# Patient Record
Sex: Female | Born: 1985 | Race: Black or African American | Hispanic: No | Marital: Single | Smoking: Never smoker
Health system: Southern US, Community
[De-identification: ages and names within clinical notes are randomized; demographics above are authoritative.]

## PROBLEM LIST (undated history)

## (undated) HISTORY — PX: OTHER PROCEDURE: U1053

---

## 2007-10-05 ENCOUNTER — Emergency Department (HOSPITAL_COMMUNITY): Admission: EM | Admit: 2007-10-05 | Discharge: 2007-10-05 | Payer: Self-pay | Admitting: Emergency Medicine

## 2008-01-04 ENCOUNTER — Emergency Department (HOSPITAL_COMMUNITY): Admission: EM | Admit: 2008-01-04 | Discharge: 2008-01-04 | Payer: Self-pay | Admitting: Emergency Medicine

## 2010-11-15 LAB — URINALYSIS, ROUTINE W REFLEX MICROSCOPIC
Nitrite: NEGATIVE
Specific Gravity, Urine: 1.03
pH: 7

## 2010-11-15 LAB — URINE MICROSCOPIC-ADD ON

## 2010-11-15 LAB — WET PREP, GENITAL: Clue Cells Wet Prep HPF POC: NONE SEEN

## 2015-05-21 ENCOUNTER — Emergency Department (HOSPITAL_COMMUNITY): Payer: Self-pay

## 2015-05-21 ENCOUNTER — Encounter (HOSPITAL_COMMUNITY): Payer: Self-pay | Admitting: *Deleted

## 2015-05-21 ENCOUNTER — Emergency Department (HOSPITAL_COMMUNITY)
Admission: EM | Admit: 2015-05-21 | Discharge: 2015-05-21 | Disposition: A | Discharge status: Home / Self Care | Payer: Self-pay | Attending: Emergency Medicine | Admitting: Emergency Medicine

## 2015-05-21 DIAGNOSIS — R109 Unspecified abdominal pain: Secondary | ICD-10-CM

## 2015-05-21 DIAGNOSIS — R1032 Left lower quadrant pain: Secondary | ICD-10-CM | POA: Insufficient documentation

## 2015-05-21 DIAGNOSIS — R42 Dizziness and giddiness: Secondary | ICD-10-CM | POA: Insufficient documentation

## 2015-05-21 DIAGNOSIS — R1031 Right lower quadrant pain: Secondary | ICD-10-CM | POA: Insufficient documentation

## 2015-05-21 DIAGNOSIS — Z3202 Encounter for pregnancy test, result negative: Secondary | ICD-10-CM | POA: Insufficient documentation

## 2015-05-21 DIAGNOSIS — M549 Dorsalgia, unspecified: Secondary | ICD-10-CM | POA: Insufficient documentation

## 2015-05-21 DIAGNOSIS — Z79899 Other long term (current) drug therapy: Secondary | ICD-10-CM | POA: Insufficient documentation

## 2015-05-21 LAB — COMPREHENSIVE METABOLIC PANEL
ALBUMIN: 3.5 g/dL (ref 3.5–5.0)
ALT: 18 U/L (ref 14–54)
ANION GAP: 10 (ref 5–15)
AST: 19 U/L (ref 15–41)
Alkaline Phosphatase: 53 U/L (ref 38–126)
BUN: 8 mg/dL (ref 6–20)
CHLORIDE: 102 mmol/L (ref 101–111)
CO2: 26 mmol/L (ref 22–32)
Calcium: 9.1 mg/dL (ref 8.9–10.3)
Creatinine, Ser: 0.66 mg/dL (ref 0.44–1.00)
GFR calc Af Amer: >60 mL/min (ref >60)
Glucose, Bld: 89 mg/dL (ref 65–99)
POTASSIUM: 3.9 mmol/L (ref 3.5–5.1)
Sodium: 138 mmol/L (ref 135–145)
TOTAL PROTEIN: 7.2 g/dL (ref 6.5–8.1)
Total Bilirubin: 0.5 mg/dL (ref 0.3–1.2)

## 2015-05-21 LAB — URINALYSIS, ROUTINE W REFLEX MICROSCOPIC
Bilirubin Urine: NEGATIVE
GLUCOSE, UA: NEGATIVE mg/dL
Hgb urine dipstick: NEGATIVE
Ketones, ur: NEGATIVE mg/dL
LEUKOCYTES UA: NEGATIVE
NITRITE: NEGATIVE
PH: 5.5 (ref 5.0–8.0)
Protein, ur: NEGATIVE mg/dL
SPECIFIC GRAVITY, URINE: 1.026 (ref 1.005–1.030)

## 2015-05-21 LAB — WET PREP, GENITAL
CLUE CELLS WET PREP: NONE SEEN
SPERM: NONE SEEN
TRICH WET PREP: NONE SEEN
YEAST WET PREP: NONE SEEN

## 2015-05-21 LAB — CBC
HEMATOCRIT: 35.3 % — AB (ref 36.0–46.0)
HEMOGLOBIN: 10.3 g/dL — AB (ref 12.0–15.0)
MCH: 20.3 pg — ABNORMAL LOW (ref 26.0–34.0)
MCHC: 29.2 g/dL — ABNORMAL LOW (ref 30.0–36.0)
MCV: 69.6 fL — AB (ref 78.0–100.0)
Platelets: 303 10*3/uL (ref 150–400)
RBC: 5.07 MIL/uL (ref 3.87–5.11)
RDW: 19.6 % — AB (ref 11.5–15.5)
WBC: 7.6 10*3/uL (ref 4.0–10.5)

## 2015-05-21 LAB — HCG, QUANTITATIVE, PREGNANCY: hCG, Beta Chain, Quant, S: <1 m[IU]/mL (ref <5)

## 2015-05-21 NOTE — ED Provider Notes (Signed)
CSN: [id number]     Arrival date & time 05/21/15  1753 History   First MD Initiated Contact with Patient 05/21/15 2014     Chief Complaint  Patient presents with  . Dizziness  . Back Pain   (Consider location/radiation/quality/duration/timing/severity/associated sxs/prior Treatment) Patient is a 30 y.o. female presenting with dizziness and back pain. The history is provided by the patient and a relative. No language interpreter was used.  Dizziness Back Pain Janice Lewis is a 30 y.o female with no past medical history who presents with lower abdominal cramping and back pain that feels like her menstrual cycle for the past week. She states she returned from [place] 3 days ago and while she was there she had dizziness. They diagnosed her with malaria and she had some shots while she was there. She is also diagnosed with a UTI and anemia. She has been taking iron and took antibiotics for the UTI. She reports urinary frequency but no hematuria or dysuria. She denies any fever, chills, nausea, vomiting. She states that her menstrual cycles have been very light recently and are usually heavy. There also darker in color than normal. Her last menstrual period was 05/09/2015. She reports still feeling dizzy since returning from [place]. She is sexually active and not on birth control. Denies any history of STDs. She denies any fever, chills, night sweats, chest pain, shortness of breath, nausea, vomiting, diarrhea, or constipation.  History reviewed. No pertinent past medical history. History reviewed. No pertinent past surgical history. No family history on file. Social History  Substance Use Topics  . Smoking status: Never Smoker   . Smokeless tobacco: None  . Alcohol Use: No   OB History    No data available     Review of Systems  Musculoskeletal: Positive for back pain.  Neurological: Positive for dizziness.  All other systems reviewed and are negative.     Allergies  Review of patient's  allergies indicates no known allergies.  Home Medications   Prior to Admission medications   Medication Sig Start Date End Date Taking? Authorizing Provider  Iron TABS Take 1 tablet by mouth daily.   Yes Historical Provider, MD   BP 118/88 mmHg  Pulse 80  Temp(Src) 98.4 F (36.9 C) (Oral)  Resp 20  Ht  (1.626 m)  Wt 68.947 kg  BMI 26.08 kg/m2  SpO2 99%  LMP 05/09/2015 Physical Exam  Constitutional: She is oriented to person, place, and time. She appears well-developed and well-nourished. No distress.  HENT:  Head: Normocephalic and atraumatic.  Eyes: Conjunctivae are normal.  Neck: Normal range of motion. Neck supple.  Cardiovascular: Normal rate, regular rhythm and normal heart sounds.   Pulmonary/Chest: Effort normal and breath sounds normal. No respiratory distress. She has no wheezes.  Abdominal: Soft. Normal appearance. She exhibits no distension. There is tenderness in the suprapubic area. There is no rebound, no guarding and no CVA tenderness.    Bilateral suprapubic abdominal tenderness. No guarding or rebound.  No CVA tenderness. Normal appearing abdomen.   Naval piercing without signs of infection.   Genitourinary: Pelvic exam was performed with patient supine.  Pelvic exam: Chaperone present. Copious amounts of white odorous vaginal discharge. No vaginal bleeding. No CMT. Bilateral adnexal tenderness.  Musculoskeletal: Normal range of motion.  Neurological: She is alert and oriented to person, place, and time.  Skin: Skin is warm and dry.  Nursing note and vitals reviewed.   ED Course  Procedures (including critical care time) Labs  Review Labs Reviewed  WET PREP, GENITAL - Abnormal; Notable for the following:    WBC, Wet Prep HPF POC FEW (*)    All other components within normal limits  CBC - Abnormal; Notable for the following:    Hemoglobin 10.3 (*)    HCT 35.3 (*)    MCV 69.6 (*)    MCH 20.3 (*)    MCHC 29.2 (*)    RDW 19.6 (*)    All other  components within normal limits  COMPREHENSIVE METABOLIC PANEL  URINALYSIS, ROUTINE W REFLEX MICROSCOPIC (NOT AT [facility])  HCG, QUANTITATIVE, PREGNANCY  GC/CHLAMYDIA PROBE AMP ([place]) NOT AT [facility]    Imaging Review Kore[place] Transvaginal Non-ob  05/21/2015  CLINICAL DATA:  Pelvic pain EXAM: TRANSABDOMINAL AND TRANSVAGINAL ULTRASOUND OF PELVIS TECHNIQUE: Both transabdominal and transvaginal ultrasound examinations of the pelvis were performed. Transabdominal technique was performed for global imaging of the pelvis including uterus, ovaries, adnexal regions, and pelvic cul-de-sac. It was necessary to proceed with endovaginal exam following the transabdominal exam to visualize the ovaries. COMPARISON:  None FINDINGS: Uterus Measurements: 6.0 x 4.1 x 4.5 cm. No fibroids or other mass visualized. Endometrium Thickness: 14 mm.  Mildly heterogeneous Right ovary Measurements: 3.9 x 2.9 x 3.9 cm. Normal appearance/no adnexal mass. Left ovary Measurements: 3.8 x 2.7 x 2.7 cm. Normal appearance/no adnexal mass. Other findings Small amount of free pelvic fluid is seen. This may be physiologic in nature. IMPRESSION: Free fluid in the pelvis which may be physiologic in nature. Mildly prominent endometrium likely related to the patient's menstrual status. Electronically Signed   By: Alcide CleverMark  Lukens M.D.   On: 05/21/2015 21:27   [place] Pelvis Complete  05/21/2015  CLINICAL DATA:  Pelvic pain EXAM: TRANSABDOMINAL AND TRANSVAGINAL ULTRASOUND OF PELVIS TECHNIQUE: Both transabdominal and transvaginal ultrasound examinations of the pelvis were performed. Transabdominal technique was performed for global imaging of the pelvis including uterus, ovaries, adnexal regions, and pelvic cul-de-sac. It was necessary to proceed with endovaginal exam following the transabdominal exam to visualize the ovaries. COMPARISON:  None FINDINGS: Uterus Measurements: 6.0 x 4.1 x 4.5 cm. No fibroids or other mass visualized. Endometrium Thickness: 14 mm.   Mildly heterogeneous Right ovary Measurements: 3.9 x 2.9 x 3.9 cm. Normal appearance/no adnexal mass. Left ovary Measurements: 3.8 x 2.7 x 2.7 cm. Normal appearance/no adnexal mass. Other findings Small amount of free pelvic fluid is seen. This may be physiologic in nature. IMPRESSION: Free fluid in the pelvis which may be physiologic in nature. Mildly prominent endometrium likely related to the patient's menstrual status. Electronically Signed   By: Alcide CleverMark  Lukens M.D.   On: 05/21/2015 21:27   I have personally reviewed and evaluated these images and lab results as part of my medical decision-making.   EKG Interpretation None      Orthostatics:  Vital Signs - Pulse Rate: 79 ; Pulse Rate Source: Monitor ; Resp: 18 ; BP: 120/83 mmHg ; BP Location: Right Arm ; Patient Position (if appropriate): Lying                22:17:29 Vital Signs BW  Vital Signs - Pulse Rate: 83 ; Pulse Rate Source: Monitor ; Resp: 18 ; BP: 126/89 mmHg ; BP Location: Right Arm ; BP Method: Automatic ; Patient Position (if appropriate): Sitting                22:19:59 Vital Signs BW  Vital Signs - Pulse Rate: 80 ; Pulse Rate Source: Monitor ; Resp:  20 ; BP: 118/88 mmHg ; BP Location: Right Arm ; Patient Position (if appropriate): Standing       MDM   Final diagnoses:  Abdominal pain  Dizziness   Patient diagnosed with malaria, UTI, and anemia one month ago while in [place]. She states she was treated for malaria while she was there. She began having worsening abdominal cramping and back pain in the last week. She denies any nausea or vomiting. Denies any rectal bleeding. She states she has also felt dizzy. She reports stopping her iron medication a week ago. She also states that she feels like her menstrual cycle but it is not due for a while. Vitals are stable. Patient is afebrile. No hypotension that would be suggestive of ovarian torsion. She has no focal abdominal tenderness. She had bilateral suprapubic abdominal  tenderness on exam with adnexal tenderness. She is mildly anemic.  She denies any constipation when using the iron pills.  I discussed resuming this and drinking plenty of fluids. She is not orthostatic in the ED.  Wet prep is not concerning. She has no UTI. Pregnancy negative. She has a small amount of free fluid in the pelvis. I discussed lab findings with patient. I am unsure of etiology of pain. I do not believe is is related to her bowels. No ovarian cyst. No CMT. I discussed following up with women's outpatient clinic. Return precautions discussed and patient agrees with plan.  Filed Vitals:   05/21/15 2219 05/21/15 2220  BP: 118/88 118/88  Pulse: 80 80  Temp:    Resp: 20 [id number] PA-C 05/21/15 2318  Arby Barrette, MD 05/22/15 1450

## 2015-05-21 NOTE — ED Notes (Signed)
Pt states she was just in [place] and returned 3 days ago.  While she was in [place] she was dx with Malaria, anemia and UTI.  Pt states she continues to feel light-headed, weak, have back cramps and menstrual-like cramps.  LMP 3/26.

## 2015-05-21 NOTE — ED Notes (Signed)
Patient transported to Ultrasound 

## 2015-05-21 NOTE — Discharge Instructions (Signed)
Abdominal Pain, Adult Follow-up with women's outpatient clinic. Many things can cause belly (abdominal) pain. Most times, the belly pain is not dangerous. Many cases of belly pain can be watched and treated at home. HOME CARE   Do not take medicines that help you go poop (laxatives) unless told to by your doctor.  Only take medicine as told by your doctor.  Eat or drink as told by your doctor. Your doctor will tell you if you should be on a special diet. GET HELP IF:  You do not know what is causing your belly pain.  You have belly pain while you are sick to your stomach (nauseous) or have runny poop (diarrhea).  You have pain while you pee or poop.  Your belly pain wakes you up at night.  You have belly pain that gets worse or better when you eat.  You have belly pain that gets worse when you eat fatty foods.  You have a fever. GET HELP RIGHT AWAY IF:   The pain does not go away within 2 hours.  You keep throwing up (vomiting).  The pain changes and is only in the right or left part of the belly.  You have bloody or tarry looking poop. MAKE SURE YOU:   Understand these instructions.  Will watch your condition.  Will get help right away if you are not doing well or get worse.   This information is not intended to replace advice given to you by your health care provider. Make sure you discuss any questions you have with your health care provider.   Document Released: 07/19/2007 Document Revised: 02/20/2014 Document Reviewed: 10/09/2012 Elsevier Interactive Patient Education 2016 Elsevier Inc[employer]  Dizziness Dizziness is a common problem. It makes you feel unsteady or lightheaded. You may feel like you are about to pass out (faint). Dizziness can lead to injury if you stumble or fall. Anyone can get dizzy, but dizziness is more common in older adults. This condition can be caused by a number of things, including:  Medicines.  Dehydration.  Illness. HOME  CARE Following these instructions may help with your condition: Eating and Drinking  Drink enough fluid to keep your pee (urine) clear or pale yellow. This helps to keep you from getting dehydrated. Try to drink more clear fluids, such as water.  Do not drink alcohol.  Limit how much caffeine you drink or eat if told by your doctor.  Limit how much salt you drink or eat if told by your doctor. Activity  Avoid making quick movements.  When you stand up from sitting in a chair, steady yourself until you feel okay.  In the morning, first sit up on the side of the bed. When you feel okay, stand slowly while you hold onto something. Do this until you know that your balance is fine.  Move your legs often if you need to stand in one place for a long time. Tighten and relax your muscles in your legs while you are standing.  Do not drive or use heavy machinery if you feel dizzy.  Avoid bending down if you feel dizzy. Place items in your home so that they are easy for you to reach without leaning over. Lifestyle  Do not use any tobacco products, including cigarettes, chewing tobacco, or electronic cigarettes. If you need help quitting, ask your doctor.  Try to lower your stress level, such as with yoga or meditation. Talk with your doctor if you need help. General Instructions  Watch your dizziness for any changes.  Take medicines only as told by your doctor. Talk with your doctor if you think that your dizziness is caused by a medicine that you are taking.  Tell a friend or a family member that you are feeling dizzy. If he or she notices any changes in your behavior, have this person call your doctor.  Keep all follow-up visits as told by your doctor. This is important. GET HELP IF:  Your dizziness does not go away.  Your dizziness or light-headedness gets worse.  You feel sick to your stomach (nauseous).  You have trouble hearing.  You have new symptoms.  You are unsteady on  your feet or you feel like the room is spinning. GET HELP RIGHT AWAY IF:  You throw up (vomit) or have diarrhea and are unable to eat or drink anything.  You have trouble:  Talking.  Walking.  Swallowing.  Using your arms, hands, or legs.  You feel generally weak.  You are not thinking clearly or you have trouble forming sentences. It may take a friend or family member to notice this.  You have:  Chest pain.  Pain in your belly (abdomen).  Shortness of breath.  Sweating.  Your vision changes.  You are bleeding.  You have a headache.  You have neck pain or a stiff neck.  You have a fever.   This information is not intended to replace advice given to you by your health care provider. Make sure you discuss any questions you have with your health care provider.   Document Released: 01/19/2011 Document Revised: 06/16/2014 Document Reviewed: 01/26/2014 Elsevier Interactive Patient Education [employer]

## 2015-05-24 LAB — GC/CHLAMYDIA PROBE AMP (~~LOC~~) NOT AT ARMC
CHLAMYDIA, DNA PROBE: NEGATIVE
Neisseria Gonorrhea: NEGATIVE

## 2015-07-14 ENCOUNTER — Ambulatory Visit (INDEPENDENT_AMBULATORY_CARE_PROVIDER_SITE_OTHER): Payer: Self-pay | Admitting: Physician Assistant

## 2015-07-14 ENCOUNTER — Ambulatory Visit (INDEPENDENT_AMBULATORY_CARE_PROVIDER_SITE_OTHER): Payer: Self-pay

## 2015-07-14 VITALS — BP 110/72 | HR 80 | Temp 98.0°F | Resp 18 | Ht 64.0 in | Wt 159.0 lb

## 2015-07-14 DIAGNOSIS — K5900 Constipation, unspecified: Secondary | ICD-10-CM

## 2015-07-14 DIAGNOSIS — R102 Pelvic and perineal pain: Secondary | ICD-10-CM

## 2015-07-14 DIAGNOSIS — Z01419 Encounter for gynecological examination (general) (routine) without abnormal findings: Secondary | ICD-10-CM

## 2015-07-14 LAB — POCT WET + KOH PREP
Trich by wet prep: ABSENT
YEAST BY WET PREP: ABSENT
Yeast by KOH: ABSENT

## 2015-07-14 MED ORDER — POLYETHYLENE GLYCOL 3350 17 GM/SCOOP PO POWD: ORAL | Status: AC

## 2015-07-14 NOTE — Patient Instructions (Addendum)
  For constipation   Make sure you are drinking enough water daily. Make sure you are getting enough fiber in your diet - this will make you regular - you can eat high fiber foods or use metamucil as a supplement - it is really important to drink enough water when using fiber supplements.  If your stools are hard or are formed balls or you have to strain a stool softener will help - use colace 2-3 capsule a day  For gentle treatment of constipation Use Miralax 2 capfuls a day (I like 1 dose 2x/day) until your stools are soft and regular for a week and then decrease the usage to once a day for at least a month  For more aggressive treatment of constipation Use 4 capfuls of Colace and 6 doses of Miralax and drink it in 2 hours - this should result in several watery stools - if it does not repeat the next day and then go to daily miralax for a week to make sure your bowels are clean and retrained to work properly  For the most aggressive treatment of constipation Use 14 capfuls of Miralax in 1 gallon of fluid (gatoraid or water work well or a combination of the two) and drink over 12h - it is ok to eat during this time and then use Miralax 1 capful daily for about 2 weeks to prevent the constipation from returning    IF you received an x-ray today, you will receive an invoice from [facility]. Please contact [facility] at [phone] with questions or concerns regarding your invoice.   IF you received labwork today, you will receive an invoice from [facility]. Please contact [facility] at [phone] with questions or concerns regarding your invoice.   Our billing staff will not be able to assist you with questions regarding bills from these companies.  You will be contacted with the lab results as soon as they are available. The fastest way to get your results is to activate your My Chart account. Instructions are located on the last page of this  paperwork. If you have not heard from us regarding the results in 2 weeks, please contact this office.    OB/GYN in [facility]  [place] - [phone] [facility] [phone] [facility] - [phone] [facility] - [phone]

## 2015-07-14 NOTE — Progress Notes (Signed)
Janice Lewis  MRN: [id number] DOB: [birth year]  Subjective:  Pt presents to clinic with pelvic pain for about the last 5 months - it seems to be before her menses except this month it has continued after her menses.  She is concerned because she has been trying to get pregnant and she has not been successful.  It feels like cramps when she has it and it goes into her back like when she has cramps but this is more severe than her normal cramps.    She is sexually active with her husband who lives in [place] -   She has intermittent constipation where the stool is harder but she will sometimes have loose stool - she has not been aware of what her stool is like during the times of pain  Menses is regular, bleeds for about 6 days heavy on days 2-5, she gets a lot of cramping with back pain with her menses  Neg gonorrhea and chlamydia in 4/17 at the ED -   There are no active problems to display for this patient.   No current outpatient prescriptions on file prior to visit.   No current facility-administered medications on file prior to visit.    No Known Allergies  Review of Systems  Constitutional: Negative for fever and chills.  Gastrointestinal: Positive for abdominal pain. Negative for nausea, vomiting and diarrhea.  Genitourinary: Negative for menstrual problem.   Objective:  BP 110/72 mmHg  Pulse 80  Temp(Src) 98 F (36.7 C) (Oral)  Resp 18  Ht  (1.626 m)  Wt 159 lb (72.122 kg)  BMI 27.28 kg/m2  SpO2 100%  LMP 07/07/2015  Physical Exam  Constitutional: She is oriented to person, place, and time and well-developed, well-nourished, and in no distress.  HENT:  Head: Normocephalic and atraumatic.  Right Ear: Hearing and external ear normal.  Left Ear: Hearing and external ear normal.  Eyes: Conjunctivae are normal.  Neck: Normal range of motion.  Cardiovascular: Normal rate, regular rhythm and normal heart sounds.   No murmur heard. Pulmonary/Chest: Effort normal  and breath sounds normal. She has no wheezes.  Abdominal: Soft. Bowel sounds are normal. There is tenderness (pelvic region =B). There is no rebound and no guarding.  Genitourinary: Vagina normal, uterus normal, cervix normal, right adnexa normal, left adnexa normal and vulva normal. Uterus is not enlarged and not tender.  Neurological: She is alert and oriented to person, place, and time. Gait normal.  Skin: Skin is warm and dry.  Psychiatric: Mood, memory, affect and judgment normal.  Vitals reviewed.   Results for orders placed or performed in visit on 07/14/15  POCT Wet + KOH Prep  Result Value Ref Range   Yeast by KOH Absent Present, Absent   Yeast by wet prep Absent Present, Absent   WBC by wet prep None None, Few, Too numerous to count   Clue Cells Wet Prep HPF POC None None, Too numerous to count   Trich by wet prep Absent Present, Absent   Bacteria Wet Prep HPF POC Moderate (A) None, Few, Too numerous to count   Epithelial Cells By [employer] ([facility]) Few None, Few, Too numerous to count   RBC,UR,HPF,POC None None RBC/hpf   Dg Abd 1 View  07/14/2015  CLINICAL DATA:  Abdominal cramping and pain.  Constipation EXAM: ABDOMEN - 1 VIEW COMPARISON:  None. FINDINGS: There is fairly mild stool volume in the colon. There is no bowel dilatation or air-fluid level suggesting obstruction.  No free air. There is a tiny presumed phlebolith in the left pelvis. There is a probable bone island in the left inferior iliac crest. IMPRESSION: No demonstrable bowel obstruction or free air. Fairly mild degree of stool volume in the colon. Electronically Signed   By: Bretta BangWilliam  Woodruff III M.D.   On: 07/14/2015 10:59    Assessment and Plan :  Female pelvic pain - Plan: POCT Wet + KOH Prep, DG Abd 1 View  Encounter for routine gynecological examination - Plan: Pap IG w/ reflex to HPV when ASC-U  Constipation, unspecified constipation type - Plan: polyethylene glycol powder (GLYCOLAX/MIRALAX) powder   D/w  pt her US done a month ago was normal.  At this time there is no GYN explanation of her pain and on her KUB she has some constipation - she will try miralax cleanse and if she is no better she plans to get an appt with gyn - names were given today.  We talked about normal time frame for getting pregnant and that her may be a little longer considering she travels to [place] to be with her husband.  We talked about things to be mindful of in regards to her eating and pain and stool patterns and her pain - almost keep a pain journal.  She agreed and understand with the above plan.  Benny LennertSarah Aylah Yeary PA-C  [facility] 07/14/2015 4:00 PM

## 2015-07-16 ENCOUNTER — Encounter: Payer: Self-pay | Admitting: Physician Assistant

## 2015-07-16 LAB — PAP IG W/ RFLX HPV ASCU

## 2016-09-09 IMAGING — US US TRANSVAGINAL NON-OB
1 series · 14 of 25 positions shown · non-contrast
Comparison: None

CLINICAL DATA: Pelvic pain



[Series 1: us transvaginal non-ob · 0.24mm/px · 14 of 70 slices shown]
[im 1/70]
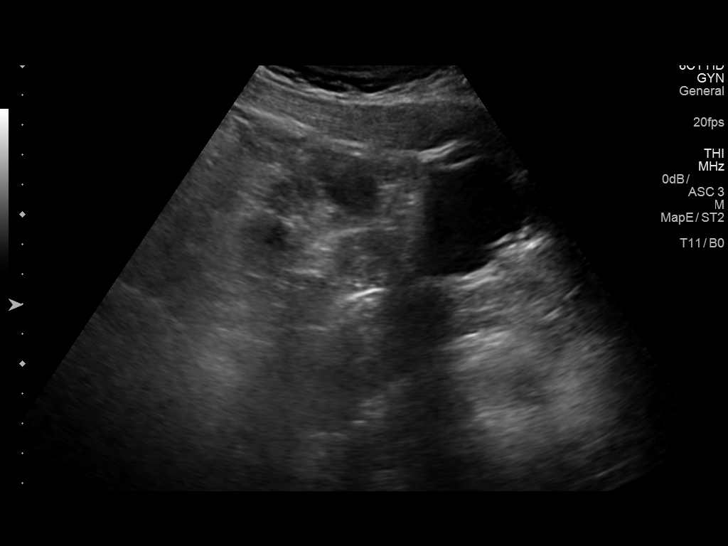
[im 6/70]
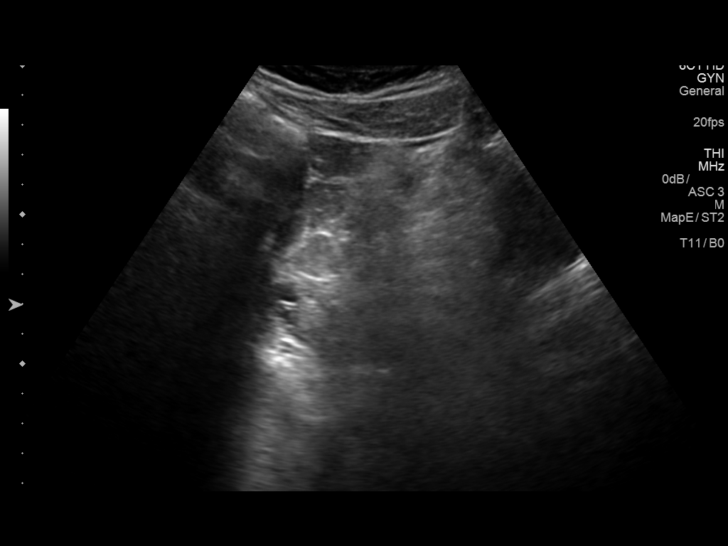
[im 12/70]
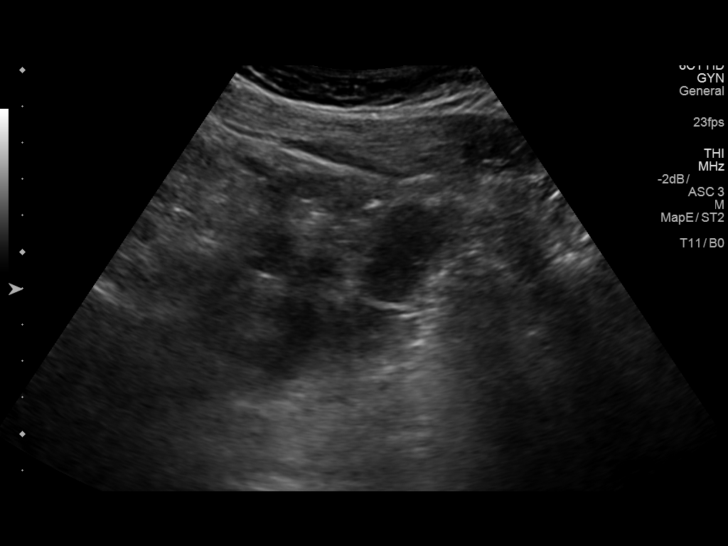
[im 18/70]
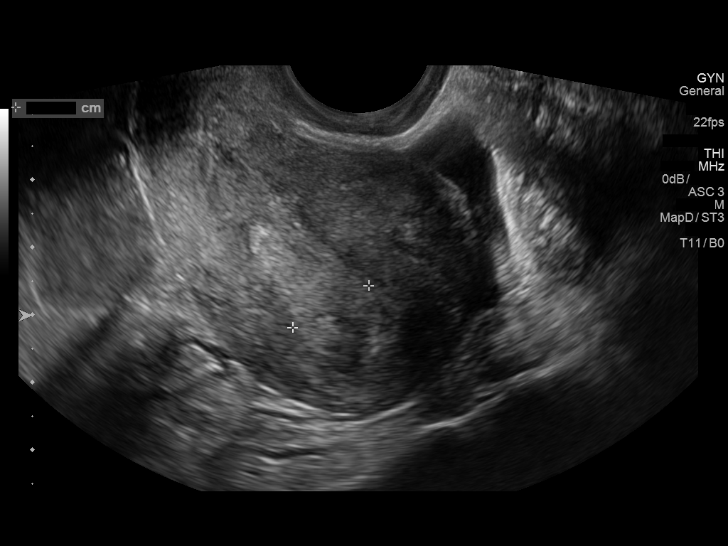
[im 24/70]
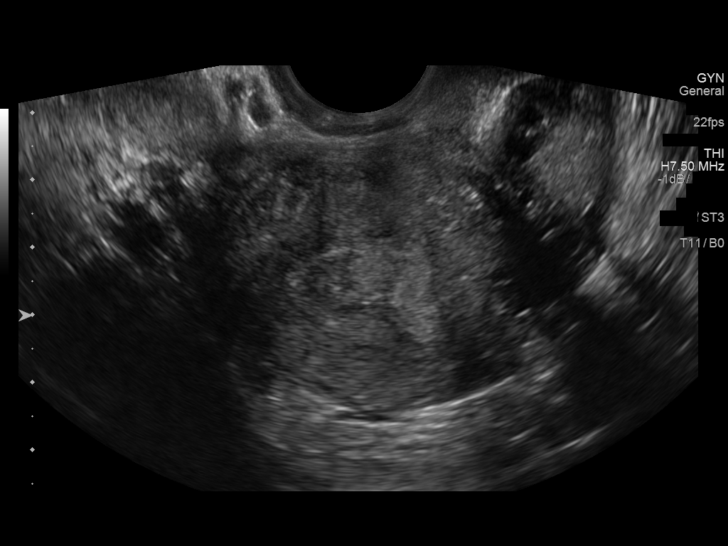
[im 26/70]
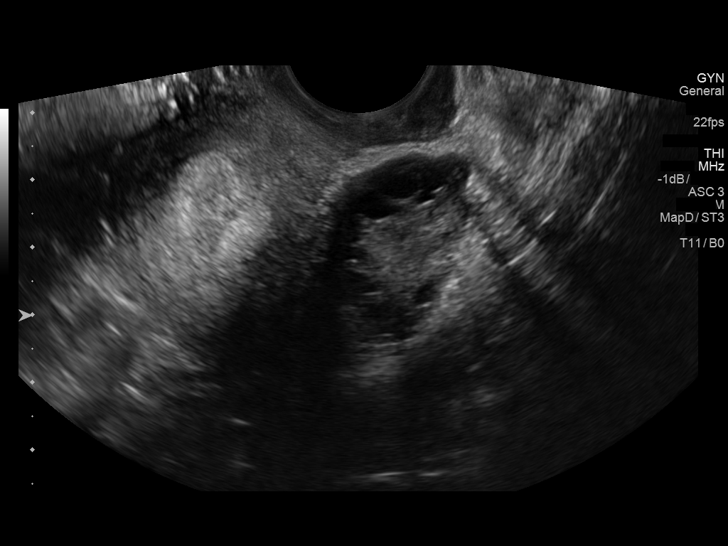
[im 32/70]
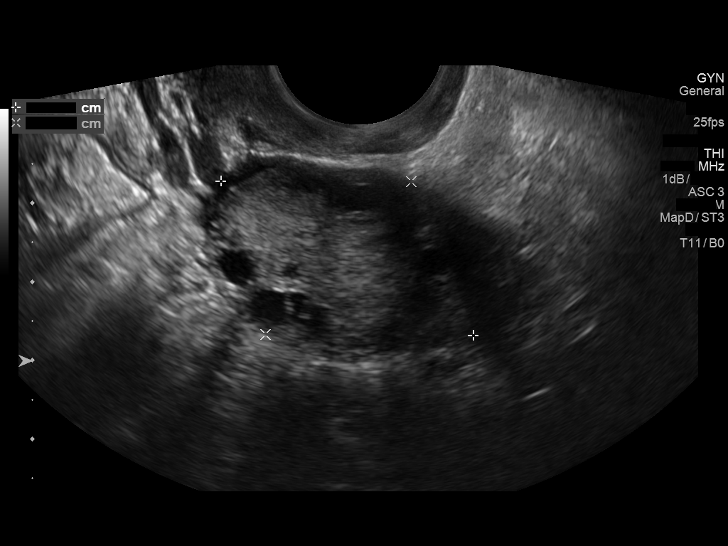
[im 38/70]
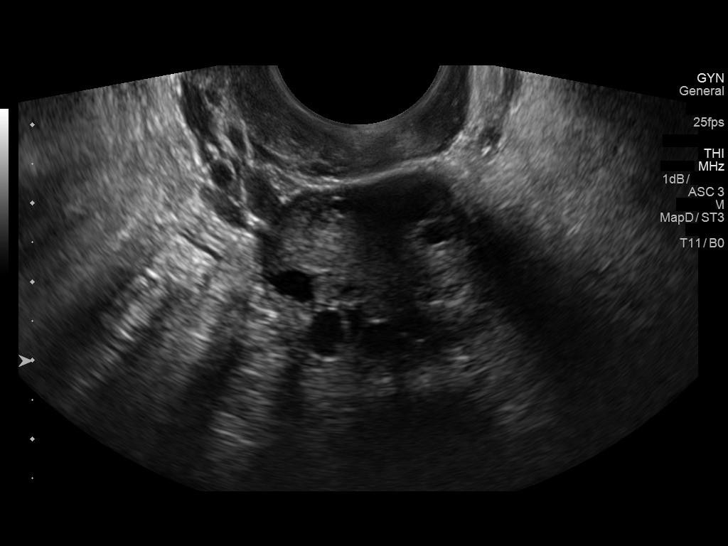
[im 44/70]
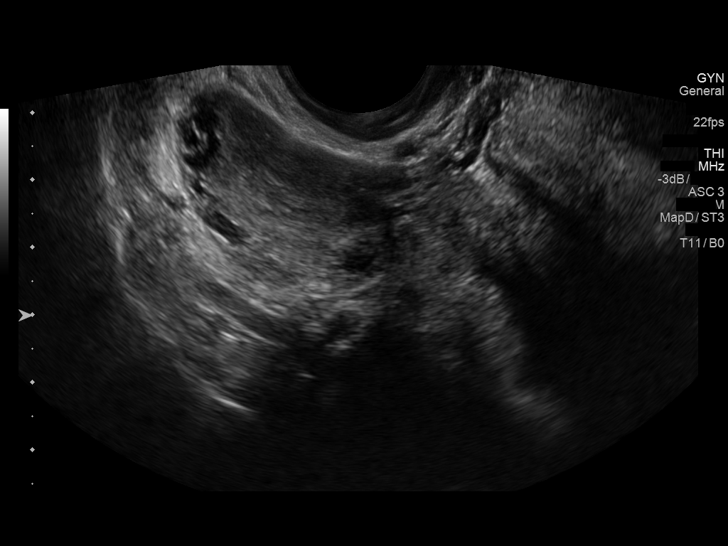
[im 47/70]
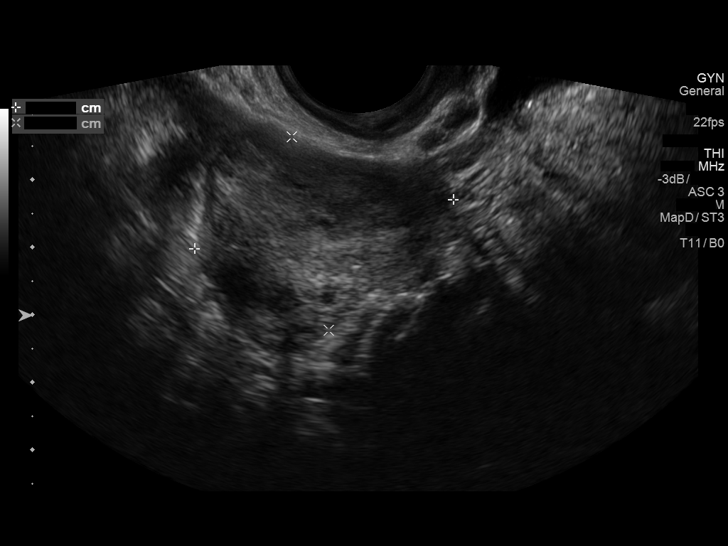
[im 52/70]
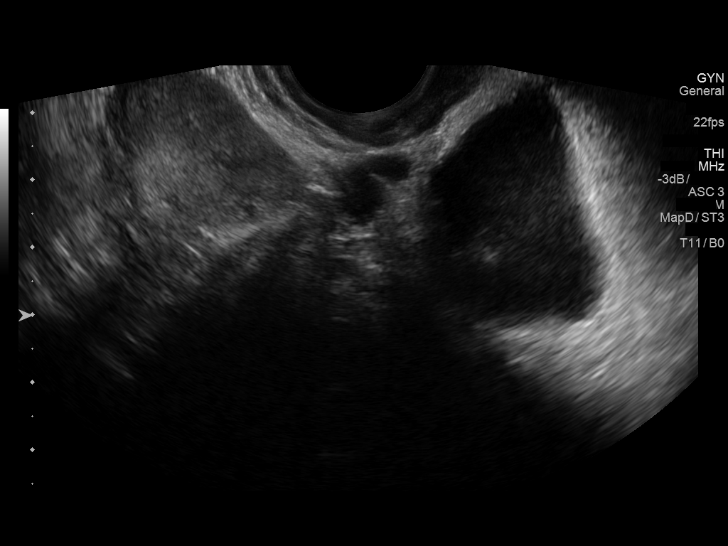
[im 58/70]
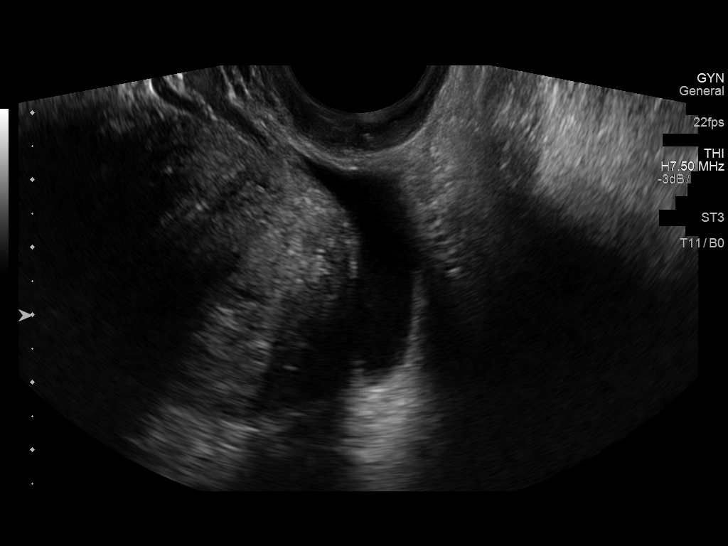
[im 64/70]
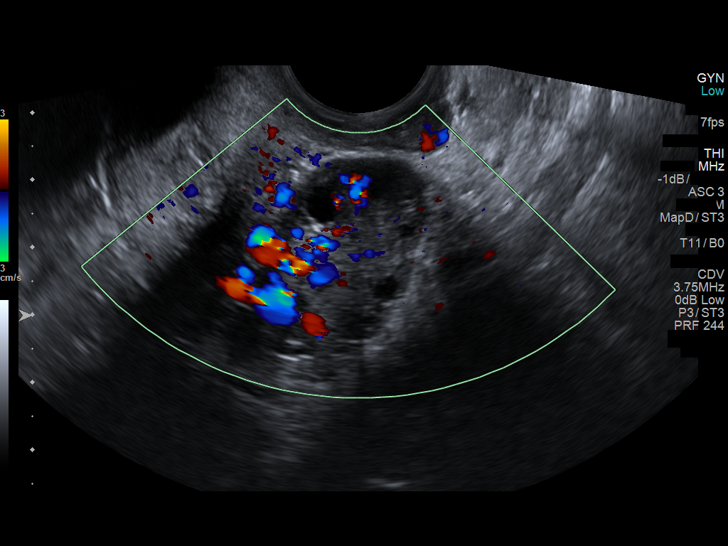
[im 70/70]
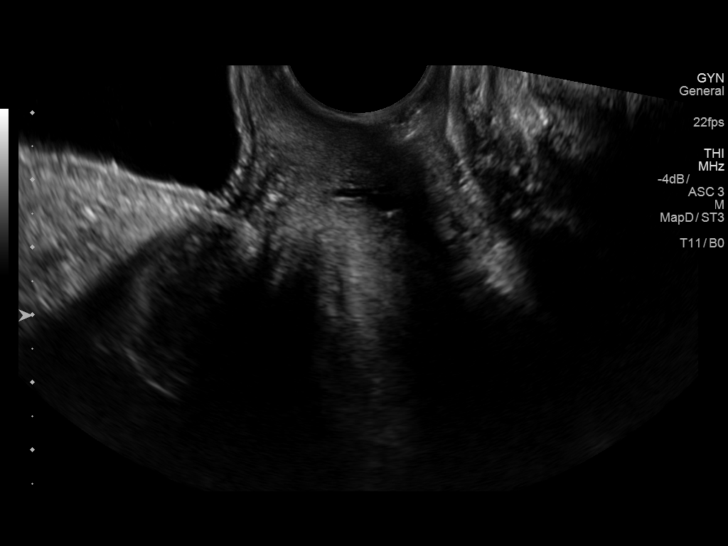

[14 of 25 positions shown; findings below may reference images not displayed]

FINDINGS: Uterus

Measurements: 6.0 x 4.1 x 4.5 cm.. No fibroids or other mass
visualized.

Endometrium

Thickness: 14 mm.  Mildly heterogeneous

Right ovary

Measurements: 3.9 x 2.9 x 3.9 cm.. Normal appearance/no adnexal
mass.

Left ovary

Measurements: 3.8 x 2.7 x 2.7 cm.. Normal appearance/no adnexal
mass.

Other findings

Small amount of free pelvic fluid is seen. This may be physiologic
in nature.
IMPRESSION: Free fluid in the pelvis which may be physiologic in nature.

Mildly prominent endometrium likely related to the patient's
menstrual status.

## 2016-10-11 IMAGING — CR DG ABDOMEN 1V
1 series · 1 of 1 positions shown · non-contrast
Comparison: None.

CLINICAL DATA: Abdominal cramping and pain.  Constipation

EXAM:
ABDOMEN - 1 VIEW

[AP]
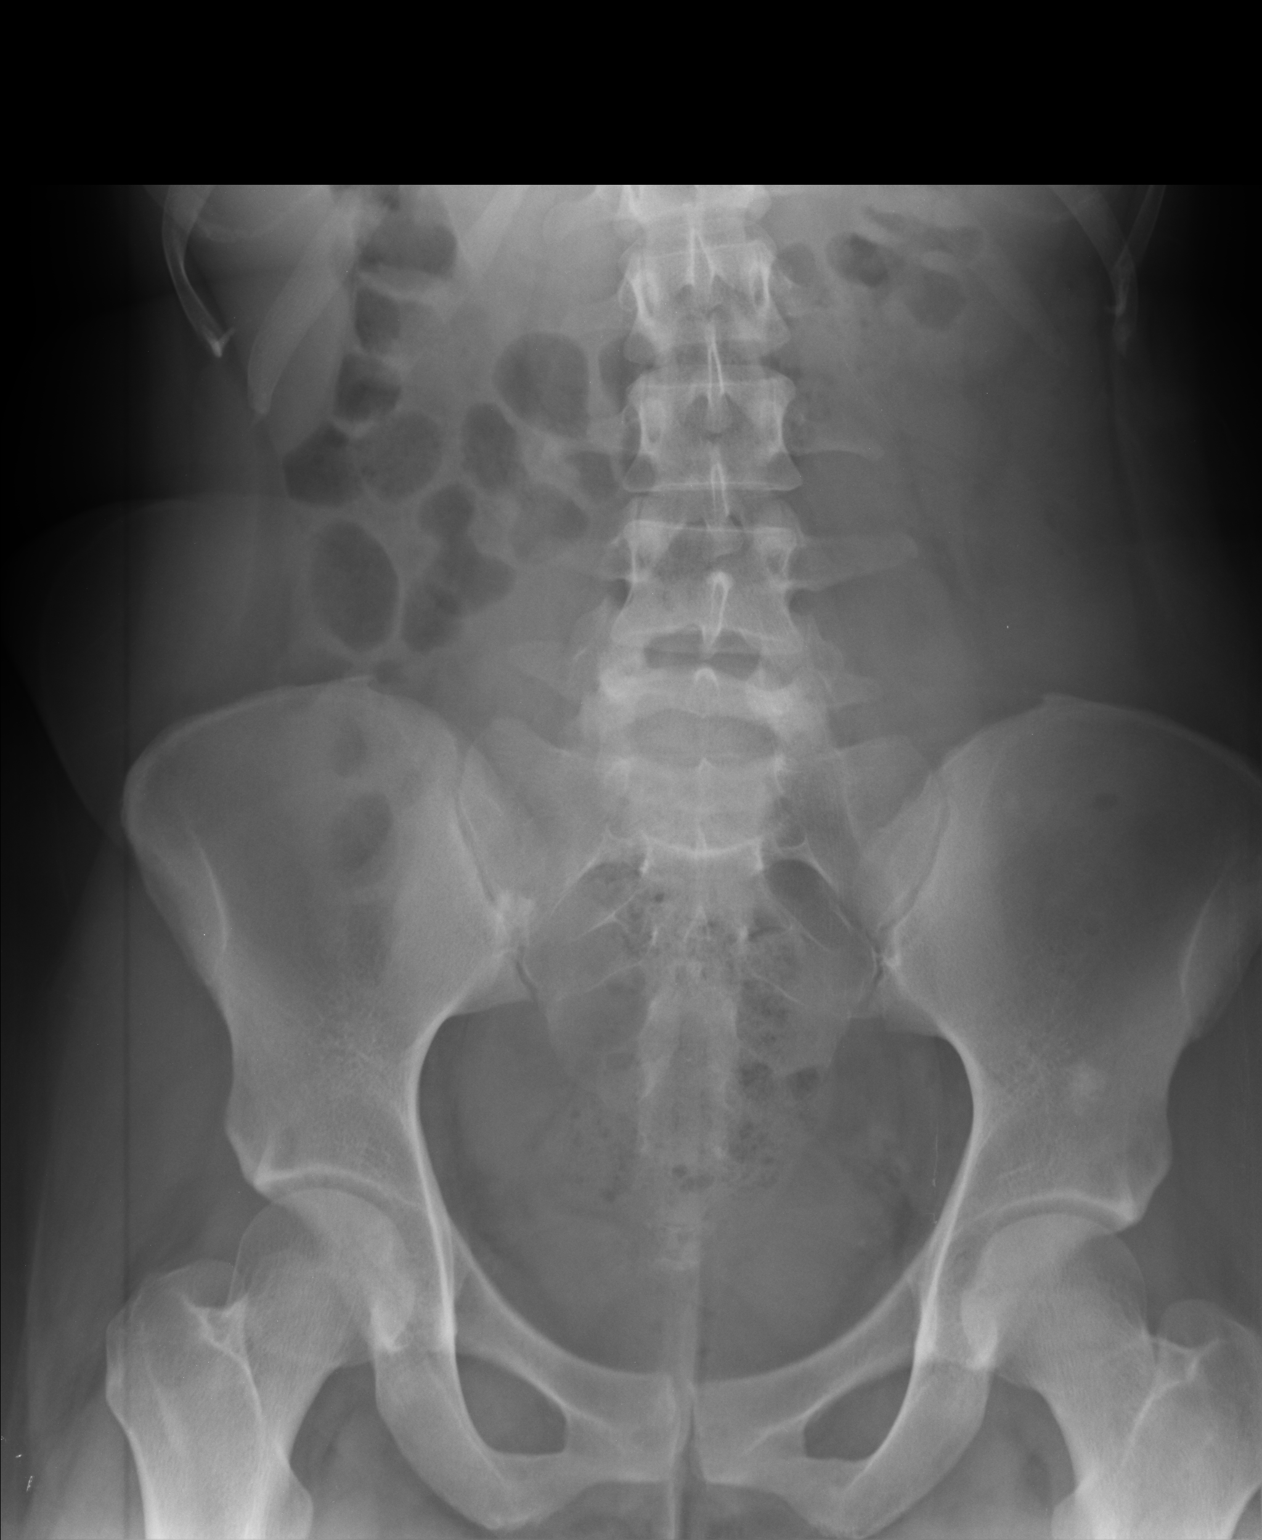

[1 of 1 positions shown; findings below may reference images not displayed]

FINDINGS: There is fairly mild stool volume in the colon. There is no bowel
dilatation or air-fluid level suggesting obstruction. No free air.
There is a tiny presumed phlebolith in the left pelvis. There is a
probable bone island in the left inferior iliac crest.
IMPRESSION: No demonstrable bowel obstruction or free air. Fairly mild degree of
stool volume in the colon.

## 2017-07-28 ENCOUNTER — Emergency Department
Admission: EM | Admit: 2017-07-28 | Discharge: 2017-07-28 | Disposition: A | Discharge status: Home / Self Care | Payer: Medicaid Other | Attending: Emergency Medical Services | Admitting: Emergency Medical Services

## 2017-07-28 DIAGNOSIS — O98811 Other maternal infectious and parasitic diseases complicating pregnancy, first trimester: Principal | ICD-10-CM | POA: Insufficient documentation

## 2017-07-28 DIAGNOSIS — Z3A00 Weeks of gestation of pregnancy not specified: Secondary | ICD-10-CM | POA: Insufficient documentation

## 2017-07-28 DIAGNOSIS — B373 Candidiasis of vulva and vagina: Secondary | ICD-10-CM | POA: Insufficient documentation

## 2017-07-28 LAB — URINALYSIS WITH CULTURE REFLEX, WHEN INDICATED
Bilirubin, UA: NEGATIVE
Glucose, UA: NEGATIVE MG/DL
Hemoglobin, UA: NEGATIVE
Ketones, UA: NEGATIVE MG/DL
Nitrite, UA: NEGATIVE
Protein, UA: NEGATIVE MG/DL
Specific Grav, UA: 1.025 (ref 1.003–1.030)
Squamous Epithelial, UA: 11 /HPF — ABNORMAL HIGH (ref 0–10)
Urobilinogen, UA: <2 MG/DL (ref <2.0)
WBC, UA: 5 #/HPF (ref 0–5)
pH, UA: 6 (ref 5.0–8.0)

## 2017-07-28 LAB — URINE HCG

## 2017-07-28 MED ORDER — ACETAMINOPHEN 325 MG OR TABS
ORAL_TABLET | ORAL | Status: AC
Start: 2017-07-28 — End: 2017-07-28
  Administered 2017-07-28 (×2): 650 mg via ORAL
  Filled 2017-07-28: qty 2

## 2017-07-28 MED ORDER — FLUCONAZOLE 100 MG OR TABS
150.00 mg | ORAL_TABLET | Freq: Once | ORAL | Status: AC
Start: 2017-07-28 — End: 2017-07-28
  Administered 2017-07-28 (×2): 150 mg via ORAL
  Filled 2017-07-28: qty 2

## 2017-07-28 MED ORDER — ACETAMINOPHEN 325 MG OR TABS
650.00 mg | ORAL_TABLET | Freq: Once | ORAL | Status: AC
Start: 2017-07-28 — End: 2017-07-28
  Administered 2017-07-28: 650 mg via ORAL

## 2017-07-28 NOTE — ED Notes (Signed)
Pt medicated and dc'd all questions answered pt left with steady gait.

## 2017-07-28 NOTE — Discharge Instructions (Signed)
Thank you for allowing [place] to care for you today at [facility]. Today, you were seen for mouth pain, pregnancy. Based on your workup here in the emergency department, you were deemed stable for discharge.     Please follow up with your regular doctor to ensure there are no other medical issues after your emergency department visit today.    Please return to the ER if you develop fevers, worsening of your symptoms, or have any other concerns.      If you have any outstanding lab or imaging studies at the time of your discharge, the phone number to contact us here in the emergency department is [phone].     If you were prescribed any narcotic or sedative medications here in the ER, please do not drive after taking these medications and do not mix them with alcohol.       Pregnancy    It has been determined you are pregnant.    You originally came here for another problem. However, pregnancy will affect how we evaluate and treat your medical condition.    You have been referred to an obstetrician [facility] doctor). Call to make your appointment today. Close follow-up with an OB doctor is very important for you and your baby.    Get follow-up care as soon as possible, certainly within the next few days. Inform your doctor or obstetrician of your visit here and your pregnancy.    If you smoke, stop. This is necessary to protect yourself and the health of your fetus (developing baby). Smoking makes miscarriage (losing your pregnancy) more likely.    Abusing alcohol or drugs will hurt the fetus (developing baby). It may cause a miscarriage, abnormal development or premature birth. It can also cause severe health or mental problems after birth. Avoid these substances from now until the end of your pregnancy.    If taking pain medicines, antipsychotics, antidepressants or seizure medicine, check with your doctor as soon as possible. Ask if you should continue with these medicines.    Return here or go to the  nearest Emergency Department right away if you have: Pain or cramping in the abdomen (belly) or pelvis, vaginal bleeding, nausea/vomiting, dizziness/lightheadedness or passing out.    Keep yourself well-hydrated at all times. Drink a lot of fluids and eat a balanced, healthy, nutritious diet.    Take prenatal vitamins every day. These may be prescribed for you on this visit. You may need to get them from the follow-up care provider. You can also get less expensive kinds over the counter (without a prescription). Ask you pharmacist for details.               The Importance Of Dental Care (Edu)    Here is some information on dental care.    If not properly treated, any dental condition can turn into a bad problem quickly! Some dental problems can be a sign of serious health problems. These include diabetes, HIV infection, and others. Usually, the problem is from not taking good enough care of your teeth.     Periodontal diseases are diseases of the gums. These include gingivitis and periodontitis. They are serious, ongoing infections. They can affect one or several teeth. It starts when your gums get irritated by the bacteria in plaque. Plaque is the clear film that always forms on your teeth. Early symptoms are redness and swelling of the gums. This causes the gums to bleed easily. It is usually painless.  Gingivitis is usually caused by a lack of oral hygiene. Gingivitis can be fixed with proper professional treatment and routine mouth care at home. If it is untreated, periodontal disease can cause the bone and fibers supporting your teeth to be destroyed. Eventually, you can lose your teeth.    Pregnant women need to maintain good oral health and get treatment for dental problems quickly. According to the American Academy of Periodontology, gum disease is linked to preterm babies (born too early) and low birth weight. In fact, pregnant women with periodontal disease may be seven times more likely to have a  preterm baby with a low birth weight.    Good dental and oral health keeps your teeth healthy. It also gives you confidence about your self-image and appearance.    Helpful Tips for Good Dental/Oral Hygiene:   Stop smoking.   Eat a healthy, well-balanced diet.   See your dentist every 6 months for regular cleanings and check-ups.   Brush your teeth at least twice a day and after meals.   Brush for about 2 minutes. This includes brushing your tongue.   Use a plaque rinse before brushing your teeth.   Floss at least twice a day.   Use a soft-bristled toothbrush for gentle brushing.   Replace your toothbrush every 3-4 months.   Promptly call your dentist if you notice any problems. These include: Your gums are bleeding, red or swollen or you are sensitive to hot or cold. You may have a toothache, loose teeth, a bad taste in your mouth or other problems of concerns.    For more information, contact the American Dental Association or visit [email or web address]

## 2017-07-28 NOTE — ED Provider Notes (Signed)
CHIEF COMPLAINT  Mouth Pain (s/p dental procedure done on Thursday, pain since then, not taking medications because she found she is pregnant)      HISTORY OF PRESENT ILLNESS:   Rachael Wade is a 32 year old female who presents with pain in mouth after having root canal, left lower mouth, site of tooth extraction, 10/10, ache, nonradiating. No fever, chills, nausea, vomiting, abd pain, chest pain, back pain, dysuria, hematuria.  Patient states LMP was 07/02/17, but she just found out her home pregnancy test was positive. As a result, she stopped taking the two medications prescribed by dentist (unsure of names of medications). Patient states she is extremely concerned to take any medications while pregnant and would like to confirm if she is pregnant given positive test was from store bought test.       REVIEW OF SYSTEMS:  Constitutional: - fever  Head: - headache  CV: - chest pain  Resp: - shortness of breath  GI: - vomiting  Back: - back pain  Skin: - rash  Neuro: - focal weakness    All other systems reviewed and negative except as noted above    PAST MEDICAL HISTORY:  None    SURGICAL HISTORY:  Past Surgical History:   Procedure Laterality Date    root cannal surgery          ALLERGIES:  No Known Allergies      CURRENT MEDICATIONS:   Please see nursing notes    FAMILY HISTORY:  Reviewed and noncontributory      SOCIAL HISTORY:  - Tobacco  - Alcohol  - Drug use    VITAL SIGNS:  BP 113/73    Pulse 88    Temp 98.2 F (36.8 C)    Resp 16    Ht 5\' 5"  (1.651 m)    Wt 72.6 kg (160 lb)    LMP 07/01/2017    SpO2 99%    Breastfeeding? No    BMI 26.63 kg/m     PHYSICAL EXAM:  General: Awake, alert, appears to be in no acute distress   Head: Normocephalic, atraumatic   Eyes: No scleral icterus, no conjunctival injection, extraocular motion intact   ENT: Normal appearing ears externally, normal appearing nose externally. Tooth #21 s/p extraction, with surrounding gum erythema and mild edema. No oropharyngeal lesions, no  thrush noted. Uvula midline, no PTA.   Neck: Supple, full range of motion of neck. No submandibular swelling.   Respiratory: Normal effort, no audible stridor   Cardiovascular: Regular rate, no murmurs, well perfused   Abdomen: Soft, non-tender, non-distended.  GYN: white thick external discharge, normal external genitalia otherwise   Back: No costovertebral angle tenderness   Skin: No jaundice, no rash   Extremities: No upper extremity asymmetry, no lower extremity asymmetry   Neuro: Awake, alert, moving all extremities   Psych: Conversing appropriately, appropriate mood and affect, memory intact    MEDICAL DECISION MAKING:  Emmamae Mcnamara is a 32 year old female who presents with mouth pain. Differential diagnosis includes dry socket, gingivitis, dental caries, IUP. Likely pain secondary to recent procedure. No signs of dry socket on exam. No dental caries visualized. No airway compromise on exam. Will obtain POCT hcg, UA, pain relief.     Patient also reports vaginal itching. discharge consistent with candida, will treat with single dose of fluconazole    Workup Summary       Value   Comment By Time       Reassessed  patient, informed her of positive pregnancy test. No dysuria or hematuria. Spoke with patient to follow up with urine culture within 24-48 hours in case patient needs antibiotics. VSS. Strict return precautions discussed.  Rosalie DoctorLahham, Sari, MD 06/15 1907             DIAGNOSIS:    ICD-10-CM ICD-9-CM   1. Pregnancy, unspecified gestational age 25Z34.90 V22.2   2. Mouth pain K13.79 528.9   3. Vagina, candidiasis B37.3 112.1                   Rosalie DoctorLahham, Sari, MD  Resident  07/28/17 2030    Attending Attestation: I discussed the case with the resident and personally examined the patient. I agree with the findings and plan as documented by the resident/fellow.  My additions or revision are included in the record.    Meda KlinefelterShannon L Lux Meaders, MD  07/28/17  9:42 PM          Meda Klinefelteroohey, Clytee Heinrich L, MD  07/28/17 2142

## 2017-07-29 LAB — URINE CULTURE
Culture Result: 40000 — AB
Culture Result: 40000 — AB
